# Patient Record
Sex: Female | Born: 2005 | Race: Black or African American | Hispanic: No | Marital: Single | Smoking: Never smoker
Health system: Southern US, Community
[De-identification: ages and names within clinical notes are randomized; demographics above are authoritative.]

## PROBLEM LIST (undated history)

## (undated) DIAGNOSIS — J302 Other seasonal allergic rhinitis: Secondary | ICD-10-CM

## (undated) HISTORY — DX: Other seasonal allergic rhinitis: J30.2

---
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
## 2023-04-29 DIAGNOSIS — Z0000 Encounter for general adult medical examination without abnormal findings: Secondary | ICD-10-CM | POA: Diagnosis not present

## 2023-04-29 DIAGNOSIS — E559 Vitamin D deficiency, unspecified: Secondary | ICD-10-CM | POA: Diagnosis not present

## 2023-04-29 DIAGNOSIS — Z7689 Persons encountering health services in other specified circumstances: Secondary | ICD-10-CM | POA: Diagnosis not present

## 2023-04-29 DIAGNOSIS — Z13220 Encounter for screening for lipoid disorders: Secondary | ICD-10-CM | POA: Diagnosis not present

## 2023-04-29 DIAGNOSIS — D509 Iron deficiency anemia, unspecified: Secondary | ICD-10-CM | POA: Diagnosis not present

## 2023-05-02 DIAGNOSIS — Z0000 Encounter for general adult medical examination without abnormal findings: Secondary | ICD-10-CM | POA: Diagnosis not present

## 2023-05-02 DIAGNOSIS — E559 Vitamin D deficiency, unspecified: Secondary | ICD-10-CM | POA: Diagnosis not present

## 2023-05-02 DIAGNOSIS — Z13220 Encounter for screening for lipoid disorders: Secondary | ICD-10-CM | POA: Diagnosis not present

## 2023-05-02 DIAGNOSIS — D509 Iron deficiency anemia, unspecified: Secondary | ICD-10-CM | POA: Diagnosis not present

## 2023-11-17 ENCOUNTER — Other Ambulatory Visit: Payer: Self-pay | Admitting: Pediatrics

## 2023-11-17 ENCOUNTER — Telehealth: Payer: Self-pay | Admitting: *Deleted

## 2023-11-17 ENCOUNTER — Other Ambulatory Visit: Payer: Self-pay | Admitting: *Deleted

## 2023-11-17 DIAGNOSIS — J302 Other seasonal allergic rhinitis: Secondary | ICD-10-CM

## 2023-11-17 NOTE — Telephone Encounter (Signed)
 Pt needs refills on Aurovela 24 FE. Thanks! JSY

## 2023-11-18 MED ORDER — NORETHINDRONE ACET-ETHINYL EST 1-20 MG-MCG PO TABS: 1.0000 | ORAL_TABLET | Freq: Every day | ORAL | 11 refills | Status: DC

## 2023-11-18 MED ORDER — AUROVELA 24 FE 1-20 MG-MCG(24) PO TABS: 1.0000 | ORAL_TABLET | Freq: Every day | ORAL | 11 refills | Status: AC

## 2023-11-18 NOTE — Telephone Encounter (Signed)
 Refilled aurovela
